# Patient Record
Sex: Female | Born: 1970 | Hispanic: No | Marital: Single | Smoking: Never smoker
Health system: Southern US, Community
[De-identification: ages and names within clinical notes are randomized; demographics above are authoritative.]

## PROBLEM LIST (undated history)

## (undated) DIAGNOSIS — Z808 Family history of malignant neoplasm of other organs or systems: Secondary | ICD-10-CM

## (undated) DIAGNOSIS — Z803 Family history of malignant neoplasm of breast: Secondary | ICD-10-CM

## (undated) DIAGNOSIS — Z8042 Family history of malignant neoplasm of prostate: Secondary | ICD-10-CM

## (undated) DIAGNOSIS — Z800 Family history of malignant neoplasm of digestive organs: Secondary | ICD-10-CM

## (undated) DIAGNOSIS — Z1501 Genetic susceptibility to malignant neoplasm of breast: Secondary | ICD-10-CM

## (undated) HISTORY — DX: Family history of malignant neoplasm of prostate: Z80.42

## (undated) HISTORY — DX: Family history of malignant neoplasm of breast: Z80.3

## (undated) HISTORY — DX: Family history of malignant neoplasm of other organs or systems: Z80.8

## (undated) HISTORY — DX: Family history of malignant neoplasm of digestive organs: Z80.0

## (undated) HISTORY — DX: Genetic susceptibility to malignant neoplasm of breast: Z15.01

---

## 2014-01-17 ENCOUNTER — Inpatient Hospital Stay (HOSPITAL_COMMUNITY): Admission: AD | Admit: 2014-01-17 | Payer: Self-pay | Source: Ambulatory Visit | Admitting: Obstetrics and Gynecology

## 2014-06-24 ENCOUNTER — Emergency Department (HOSPITAL_BASED_OUTPATIENT_CLINIC_OR_DEPARTMENT_OTHER): Payer: BC Managed Care – PPO

## 2014-06-24 ENCOUNTER — Emergency Department (HOSPITAL_BASED_OUTPATIENT_CLINIC_OR_DEPARTMENT_OTHER)
Admission: EM | Admit: 2014-06-24 | Discharge: 2014-06-24 | Disposition: A | Discharge status: Home / Self Care | Payer: BC Managed Care – PPO | Attending: Emergency Medicine | Admitting: Emergency Medicine

## 2014-06-24 ENCOUNTER — Encounter (HOSPITAL_BASED_OUTPATIENT_CLINIC_OR_DEPARTMENT_OTHER): Payer: Self-pay | Admitting: Emergency Medicine

## 2014-06-24 DIAGNOSIS — S63602A Unspecified sprain of left thumb, initial encounter: Secondary | ICD-10-CM | POA: Diagnosis not present

## 2014-06-24 DIAGNOSIS — W1830XA Fall on same level, unspecified, initial encounter: Secondary | ICD-10-CM | POA: Insufficient documentation

## 2014-06-24 DIAGNOSIS — S6991XA Unspecified injury of right wrist, hand and finger(s), initial encounter: Secondary | ICD-10-CM | POA: Diagnosis present

## 2014-06-24 DIAGNOSIS — Y9389 Activity, other specified: Secondary | ICD-10-CM | POA: Insufficient documentation

## 2014-06-24 DIAGNOSIS — S63601A Unspecified sprain of right thumb, initial encounter: Secondary | ICD-10-CM

## 2014-06-24 DIAGNOSIS — Y929 Unspecified place or not applicable: Secondary | ICD-10-CM | POA: Insufficient documentation

## 2014-06-24 MED ORDER — IBUPROFEN 800 MG PO TABS: 800.0000 mg | ORAL_TABLET | Freq: Three times a day (TID) | ORAL | Status: AC

## 2014-06-24 NOTE — ED Notes (Signed)
Pt c/o right thumb injury x 3 days

## 2014-06-24 NOTE — ED Provider Notes (Signed)
CSN: [id number]     Arrival date & time 06/24/14  1757 History   First MD Initiated Contact with Patient 06/24/14 1822     Chief Complaint  Patient presents with  . Finger Injury     (Consider location/radiation/quality/duration/timing/severity/associated sxs/prior Treatment) Patient is a 43 y.o. female presenting with hand injury. The history is provided by the patient.  Hand Injury Location:  Finger Time since incident:  2 days Injury: yes   Mechanism of injury: fall   Finger location:  R thumb Pain details:    Quality:  Aching   Radiates to:  Does not radiate   Severity:  Mild Associated symptoms: no fever     History reviewed. No pertinent past medical history. Past Surgical History  Procedure Laterality Date  . Cesarean section     History reviewed. No pertinent family history. History  Substance Use Topics  . Smoking status: Never Smoker   . Smokeless tobacco: Not on file  . Alcohol Use: No   OB History   Grav Para Term Preterm Abortions TAB SAB Ect Mult Living                 Review of Systems  Constitutional: Negative for fever and chills.  Respiratory: Negative for cough and shortness of breath.   Gastrointestinal: Negative for vomiting and abdominal pain.  All other systems reviewed and are negative.     Allergies  Review of patient's allergies indicates no known allergies.  Home Medications   Prior to Admission medications   Not on File   BP 151/91  Pulse 70  Temp(Src) 98.9 F (37.2 C)  Resp 16  Ht 5\' 4"  (1.626 m)  Wt 242 lb (109.77 kg)  BMI 41.52 kg/m2  SpO2 100%  LMP 05/29/2014 Physical Exam  Nursing note and vitals reviewed. Constitutional: She is oriented to person, place, and time. She appears well-developed and well-nourished. No distress.  HENT:  Head: Normocephalic and atraumatic.  Mouth/Throat: Oropharynx is clear and moist.  Eyes: EOM are normal. Pupils are equal, round, and reactive to light.  Neck: Normal range of  motion. Neck supple.  Cardiovascular: Normal rate and regular rhythm.  Exam reveals no friction rub.   No murmur heard. Pulmonary/Chest: Effort normal and breath sounds normal. No respiratory distress. She has no wheezes. She has no rales.  Abdominal: Soft. She exhibits no distension. There is no tenderness. There is no rebound.  Musculoskeletal: She exhibits no edema.       Right hand: She exhibits decreased range of motion (R IP joint in thumb), tenderness and swelling (mild, 1st phalanx of thumb). She exhibits no bony tenderness, normal two-point discrimination, normal capillary refill, no deformity and no laceration.  Neurological: She is alert and oriented to person, place, and time. No cranial nerve deficit. She exhibits normal muscle tone. Coordination normal.  Skin: No rash noted. She is not diaphoretic.    ED Course  Procedures (including critical care time) Labs Review Labs Reviewed - No data to display  Imaging Review Dg Finger Thumb Right  06/24/2014   CLINICAL DATA:  43 year old female with acute right thumb pain after blunt trauma 2 days ago. Redness and swelling. Initial encounter.  EXAM: RIGHT THUMB 2+V  COMPARISON:  None.  FINDINGS: Bone mineralization is within normal limits. Right thumb metacarpal and phalanges intact. Joint spaces and alignment within normal limits. No fracture identified. No radiopaque foreign body identified.  IMPRESSION: No acute osseous abnormality identified at the right thumb.  Electronically Signed   By: Lars Pinks M.D.   On: 06/24/2014 18:40     EKG Interpretation None      MDM   Final diagnoses:  Thumb sprain, right, initial encounter    60F here with R thumb injury. Was playing with son 2 days ago, thinks she jammed it. Swelling improving, most pain at IP joint of R thumb. Decreased ROM, NVI distally. Xray ok. Given thumb splint for symptomatic relief. Stable for discharge.     Evelina Bucy, MD 06/24/14 1910

## 2014-06-24 NOTE — Discharge Instructions (Signed)
Finger Sprain  A finger sprain is a tear in one of the strong, fibrous tissues that connect the bones (ligaments) in your finger. The severity of the sprain depends on how much of the ligament is torn. The tear can be either partial or complete.  CAUSES   Often, sprains are a result of a fall or accident. If you extend your hands to catch an object or to protect yourself, the force of the impact causes the fibers of your ligament to stretch too much. This excess tension causes the fibers of your ligament to tear.  SYMPTOMS   You may have some loss of motion in your finger. Other symptoms include:   Bruising.   Tenderness.   Swelling.  DIAGNOSIS   In order to diagnose finger sprain, your caregiver will physically examine your finger or thumb to determine how torn the ligament is. Your caregiver may also suggest an X-ray exam of your finger to make sure no bones are broken.  TREATMENT   If your ligament is only partially torn, treatment usually involves keeping the finger in a fixed position (immobilization) for a short period. To do this, your caregiver will apply a bandage, cast, or splint to keep your finger from moving until it heals. For a partially torn ligament, the healing process usually takes 2 to 3 weeks.  If your ligament is completely torn, you may need surgery to reconnect the ligament to the bone. After surgery a cast or splint will be applied and will need to stay on your finger or thumb for 4 to 6 weeks while your ligament heals.  HOME CARE INSTRUCTIONS   Keep your injured finger elevated, when possible, to decrease swelling.   To ease pain and swelling, apply ice to your joint twice a day, for 2 to 3 days:   Put ice in a plastic bag.   Place a towel between your skin and the bag.   Leave the ice on for 15 minutes.   Only take over-the-counter or prescription medicine for pain as directed by your caregiver.   Do not wear rings on your injured finger.   Do not leave your finger unprotected  until pain and stiffness go away (usually 3 to 4 weeks).   Do not allow your cast or splint to get wet. Cover your cast or splint with a plastic bag when you shower or bathe. Do not swim.   Your caregiver may suggest special exercises for you to do during your recovery to prevent or limit permanent stiffness.  SEEK IMMEDIATE MEDICAL CARE IF:   Your cast or splint becomes damaged.   Your pain becomes worse rather than better.  MAKE SURE YOU:   Understand these instructions.   Will watch your condition.   Will get help right away if you are not doing well or get worse.  Document Released: 09/29/2004 Document Revised: 11/14/2011 Document Reviewed: 04/25/2011  ExitCare Patient Information 2015 [employer]. This information is not intended to replace advice given to you by your health care provider. Make sure you discuss any questions you have with your health care provider.

## 2014-12-24 ENCOUNTER — Ambulatory Visit: Payer: Self-pay | Admitting: Surgery

## 2014-12-24 NOTE — H&P (Signed)
History of Present Illness Mikayla Harrington. Mikayla Hanford MD; 12/24/2014 3:57 PM) Patient words: breast mass.  The patient is a 44 year old female who presents with a breast mass. Referred by Dr. Servando Harrington for left breast mass  This is a healthy 44 year old female who presents with 3 years of a palpable mass in her left inframammary crease. This has become larger. She has had normal mammograms as well as an ultrasound of this area that showed that this likely represented just a benign sebaceous cyst. This has reached about 2.5-3 cm in size and she is concerned. She would like to have this excised. This area has never become infected or inflamed. There is been no drainage from this area. Other Problems Mikayla Harrington, CMA; 12/24/2014 3:02 PM) No pertinent past medical history  Past Surgical History Mikayla Harrington, [name]; 12/24/2014 3:02 PM) Cesarean Section - 1  Diagnostic Studies History Mikayla Harrington, CMA; 12/24/2014 3:02 PM) Colonoscopy never Mammogram within last year Pap Smear 1-5 years ago  Allergies Mikayla Harrington, CMA; 12/24/2014 3:03 PM) No Known Drug Allergies 12/24/2014  Medication History Mikayla Harrington, CMA; 12/24/2014 3:03 PM) Hydrocodone-Acetaminophen (5-325MG  Tablet, Oral) Active. Clindamycin HCl (300MG  Capsule, Oral) Active. Fluconazole (150MG  Tablet, Oral) Active. Ibuprofen (800MG  Tablet, Oral) Active. Medications Reconciled  Social History Mikayla Harrington, [name]; 12/24/2014 3:02 PM) Alcohol use Occasional alcohol use. Caffeine use Tea. No drug use Tobacco use Never smoker.  Family History Mikayla Harrington, [name]; 12/24/2014 3:02 PM) Breast Cancer Mother. Hypertension Mother, Sister. Malignant Neoplasm Of Pancreas Mother. Thyroid problems Sister.  Pregnancy / Birth History Mikayla Harrington, [name]; 12/24/2014 3:02 PM) Age at menarche 59 years. Contraceptive History Oral contraceptives. Gravida 3 Maternal age 59-20 Para 3 Regular periods     Review of  Systems Mikayla Harrington CMA; 12/24/2014 3:02 PM) General Not Present- Appetite Loss, Chills, Fatigue, Fever, Night Sweats, Weight Gain and Weight Loss. Skin Not Present- Change in Wart/Mole, Dryness, Hives, Jaundice, New Lesions, Non-Healing Wounds, Rash and Ulcer. HEENT Not Present- Earache, Hearing Loss, Hoarseness, Nose Bleed, Oral Ulcers, Ringing in the Ears, Seasonal Allergies, Sinus Pain, Sore Throat, Visual Disturbances, Wears glasses/contact lenses and Yellow Eyes. Respiratory Not Present- Bloody sputum, Chronic Cough, Difficulty Breathing, Snoring and Wheezing. Cardiovascular Present- Leg Cramps and Swelling of Extremities. Not Present- Chest Pain, Difficulty Breathing Lying Down, Palpitations, Rapid Heart Rate and Shortness of Breath. Gastrointestinal Not Present- Abdominal Pain, Bloating, Bloody Stool, Change in Bowel Habits, Chronic diarrhea, Constipation, Difficulty Swallowing, Excessive gas, Gets full quickly at meals, Hemorrhoids, Indigestion, Nausea, Rectal Pain and Vomiting. Female Genitourinary Not Present- Frequency, Nocturia, Painful Urination, Pelvic Pain and Urgency. Neurological Not Present- Decreased Memory, Fainting, Headaches, Numbness, Seizures, Tingling, Tremor, Trouble walking and Weakness. Psychiatric Not Present- Anxiety, Bipolar, Change in Sleep Pattern, Depression, Fearful and Frequent crying. Endocrine Not Present- Cold Intolerance, Excessive Hunger, Hair Changes, Heat Intolerance, Hot flashes and New Diabetes. Hematology Not Present- Easy Bruising, Excessive bleeding, Gland problems, HIV and Persistent Infections.  Vitals Mikayla Harrington CMA; 12/24/2014 3:04 PM) 12/24/2014 3:03 PM Weight: 247 lb Height: 63in Body Surface Area: 2.23 m Body Mass Index: 43.75 kg/m Temp.: 98.66F(Oral)  Pulse: 78 (Regular)  Resp.: 16 (Unlabored)  BP: 130/68 (Sitting, Left Arm, Standard)     Physical Exam Mikayla Key K. Tienna Bienkowski MD; 12/24/2014 3:53 PM)  The physical exam  findings are as follows: Note:WDWN in NAD HEENT: EOMI, sclera anicteric Neck: No masses, no thyromegaly Lungs: CTA bilaterally; normal respiratory effort Breasts - symmetric; no palpable masses in right breast; no axillary lymphadenopathy  on either side Left breast - inframammary crease - 3 cm protruding mass with central punctate opening; no drainage; no inflammation CV: Regular rate and rhythm; no murmurs Abd: +bowel sounds, soft, non-tender, no masses Ext: Well-perfused; no edema Skin: Warm, dry; no sign of jaundice    Assessment & Plan Mikayla Key K. Makenly Larabee MD; 12/24/2014 3:40 PM)  SEBACEOUS CYST OF BREAST, LEFT (610.8  N60.82)  Current Plans Schedule for Surgery - excision of sebaceous cyst - left breast - 3 cm. The surgical procedure has been discussed with the patient. Potential risks, benefits, alternative treatments, and expected outcomes have been explained. All of the patient's questions at this time have been answered. The likelihood of reaching the patient's treatment goal is good. The patient understand the proposed surgical procedure and wishes to proceed.   Mikayla Harrington. Mikayla Dover, MD, [facility]  General/ Trauma Surgery  12/24/2014 3:58 PM

## 2015-03-06 IMAGING — CR DG FINGER THUMB 2+V*R*
3 series · 3 of 3 positions shown · non-contrast
Comparison: None.

CLINICAL DATA: 42-year-old female with acute right thumb pain after
blunt trauma 2 days ago. Redness and swelling. Initial encounter.

EXAM:
RIGHT THUMB 2+V

[x finger pa right]
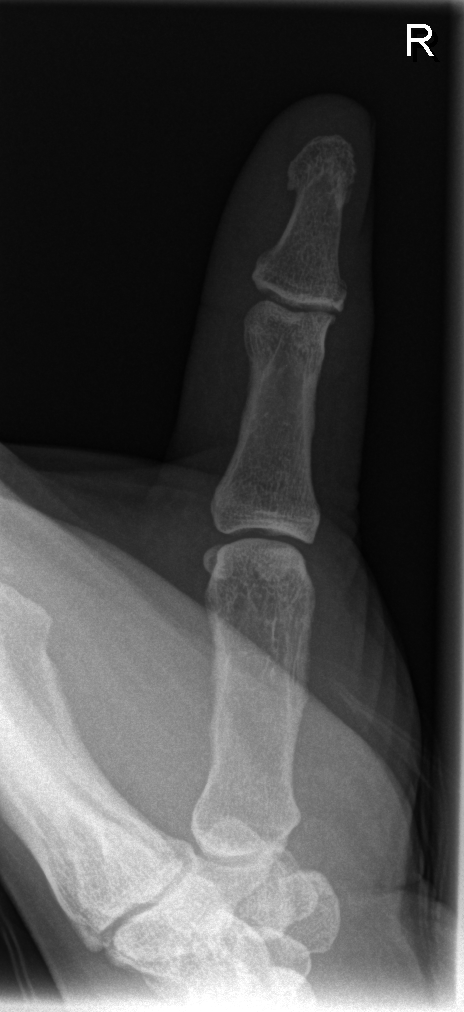

[x finger obl. right]
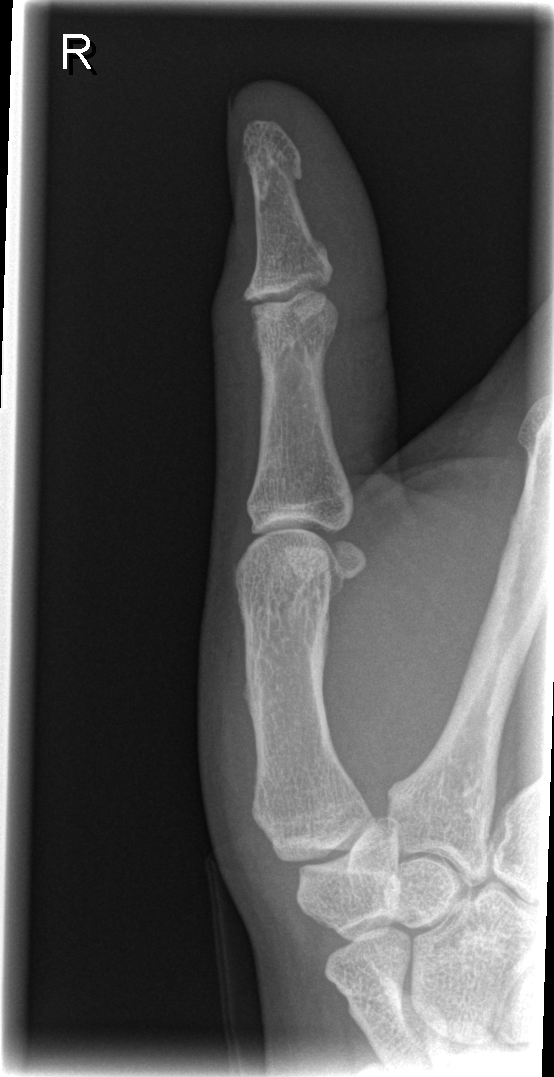

[x finger lateral right]
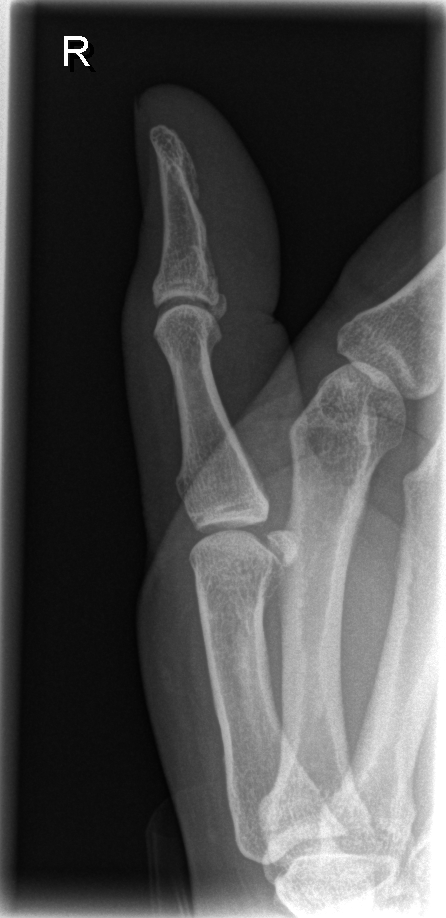

[3 of 3 positions shown; findings below may reference images not displayed]

FINDINGS: Bone mineralization is within normal limits. Right thumb metacarpal
and phalanges intact. Joint spaces and alignment within normal
limits. No fracture identified. No radiopaque foreign body
identified.
IMPRESSION: No acute osseous abnormality identified at the right thumb.

## 2015-03-16 ENCOUNTER — Encounter: Payer: BLUE CROSS/BLUE SHIELD | Attending: Obstetrics and Gynecology | Admitting: Skilled Nursing Facility1

## 2015-03-16 ENCOUNTER — Encounter: Payer: Self-pay | Admitting: Skilled Nursing Facility1

## 2015-03-16 VITALS — Ht 63.0 in | Wt 248.0 lb

## 2015-03-16 DIAGNOSIS — R7309 Other abnormal glucose: Secondary | ICD-10-CM | POA: Diagnosis not present

## 2015-03-16 DIAGNOSIS — Z713 Dietary counseling and surveillance: Secondary | ICD-10-CM | POA: Diagnosis not present

## 2015-03-16 DIAGNOSIS — E669 Obesity, unspecified: Secondary | ICD-10-CM | POA: Diagnosis not present

## 2015-03-16 DIAGNOSIS — R7303 Prediabetes: Secondary | ICD-10-CM

## 2015-03-16 DIAGNOSIS — Z6841 Body Mass Index (BMI) 40.0 and over, adult: Secondary | ICD-10-CM | POA: Diagnosis not present

## 2015-03-16 NOTE — Patient Instructions (Signed)
-  Try to only drink water -Try to avoid fried foods -Try to switch to 2% milk -Meals outside of the home once a week or less

## 2015-03-16 NOTE — Progress Notes (Signed)
  Medical Nutrition Therapy:  Appt start time: 9:15 end time:  10:00.   Assessment:  Primary concerns today: pt referred for prediabetes. Pt states her physician told her her wt is high and her A1C is high and she would like to lose wight and be healthy.Pt states she sleeps 6-7 hours a night. Pt states she eats in the kitchen table and is a slow eater. Diet hx: when living in [place] she was prescribed adipex and lost 14 pounds (2009) and kept it off until 2012-gained it back due to stress. Pts states she has not had a usual wt because she just keeps gaining over the years. Pt states she did not have gestational diabetes.  Pts A1C 6.1.  Preferred Learning Style:   No preference indicated   Learning Readiness:   Contemplating   MEDICATIONS: Multivitamin and Vitamin D   DIETARY INTAKE:  Usual eating pattern includes 3 meals and 2 snacks per day.  Everyday foods include none stated.  Avoided foods include none stated.    24-hr recall:  B ( AM): egg sandwhich Snk ( AM): popcorn L ( PM): fried chicken with beans and mac n cheese Snk ( PM): yogurt and fruit D ( PM): baked chicken, cabbage, rice  Snk ( PM): none Beverages: water, orange juice, sweet tea, whole milk  * Meals out of the house-2 times a week  Usual physical activity: ADL's  Estimated energy needs: 1800 calories 200 g carbohydrates 135 g protein 50 g fat  Progress Towards Goal(s):  In progress.   Nutritional Diagnosis:  NB-1.1 Food and nutrition-related knowledge deficit As related to no prior nutrition education from a nutrition professional.  As evidenced by BMI 43.93, A1C 6.1, pt report, and 24 hour recall.    Intervention:  Nutrition counseling for pre-diabetes. Dietitian educated the pt on pre-diabetes, a varied/balanced diet, calorically dense beverages, and the importance of physical activity.  Goals: -Try to only drink water -Try to avoid fried foods -Try to switch to 2% milk -Meals outside of  the home once a week or less  Teaching Method Utilized:  Visual Auditory  Handouts given during visit include: -MyPlate  Snack sheet  Demonstrated degree of understanding via:  Teach Back   Monitoring/Evaluation:  Dietary intake, exercise, A1C, and body weight prn.

## 2015-05-18 ENCOUNTER — Encounter: Payer: Self-pay | Admitting: Skilled Nursing Facility1

## 2015-05-18 ENCOUNTER — Encounter: Payer: BLUE CROSS/BLUE SHIELD | Attending: Obstetrics and Gynecology | Admitting: Skilled Nursing Facility1

## 2015-05-18 VITALS — Ht 63.0 in | Wt 248.0 lb

## 2015-05-18 DIAGNOSIS — R739 Hyperglycemia, unspecified: Secondary | ICD-10-CM

## 2015-05-18 DIAGNOSIS — R7309 Other abnormal glucose: Secondary | ICD-10-CM | POA: Diagnosis not present

## 2015-05-18 DIAGNOSIS — E669 Obesity, unspecified: Secondary | ICD-10-CM | POA: Insufficient documentation

## 2015-05-18 DIAGNOSIS — Z6841 Body Mass Index (BMI) 40.0 and over, adult: Secondary | ICD-10-CM | POA: Insufficient documentation

## 2015-05-18 DIAGNOSIS — Z713 Dietary counseling and surveillance: Secondary | ICD-10-CM | POA: Insufficient documentation

## 2015-05-18 NOTE — Progress Notes (Signed)
  Medical Nutrition Therapy:  Appt start time: 1130 end time:  1200.   Assessment:  Primary concerns today: follow up from July for prediabetes. Successes: more vegetables and fruit love spinach, no more fried foods, loves carrots, smaller pants and shirt size (16-14), feels better: feels better about herself and sleeps better. Down to 1% milk. Pt is doing fabulously with a lot of great changes made. No new labs to indicate A1C.  Preferred Learning Style:   No preference indicated   Learning Readiness:   Change in progress   MEDICATIONS: See List   DIETARY INTAKE:  Usual eating pattern includes 3 meals and 2-3 snacks per day.  Everyday foods include none stated.  Avoided foods include none stated.    24-hr recall:  B ( AM): egg and toast  Snk ( AM): grapes or apple L ( PM): chicken and carrots-----baked fish, potato, and carrots or broccoli Snk ( PM): none D ( PM): grilled chicken and spinach and lettuce Snk ( PM): sugar free jello Beverages: water, decaf tea or green tea, diet mountain dew  Usual physical activity: walk 20 minutes, sit ups/squats every night, oliptical machine for 30 minutes 2 days a week  Estimated energy needs: 1600 calories 180 g carbohydrates 120 g protein 44 g fat  Progress Towards Goal(s):  In progress.   Nutritional Diagnosis:  No nutritional diagnosis at this time as the pt is working towards her goals efficiently and steadily.     Intervention:  Increase your physical activity days by one and incorporate some weight bearing exercises. If you want to have a smoothie for breakfast sometimes add protein like milk or peanut butter. Try Kefir to get your gut microbiota back in balance.   Teaching Method Utilized:  Visual Auditory  Barriers to learning/adherence to lifestyle change: none identified  Demonstrated degree of understanding via:  Teach Back   Monitoring/Evaluation:  Dietary intake, exercise, and body weight prn.

## 2016-08-15 ENCOUNTER — Other Ambulatory Visit: Payer: Self-pay | Admitting: Hematology and Oncology

## 2020-07-17 ENCOUNTER — Telehealth: Payer: Self-pay | Admitting: Genetic Counselor

## 2020-07-17 NOTE — Telephone Encounter (Signed)
Received a genetic counseling referral from Dr. Garwin Brothers for BRCA2 gene mutation positive in female. Mikayla Harrington returned my call and has been scheduled to see emily on 12/2 at 1pm. Pt will bring her genetic test results along to her appointment.

## 2020-08-06 ENCOUNTER — Inpatient Hospital Stay: Payer: 59 | Attending: Genetic Counselor | Admitting: Genetic Counselor

## 2020-08-06 ENCOUNTER — Other Ambulatory Visit: Payer: Self-pay

## 2020-08-06 DIAGNOSIS — Z8042 Family history of malignant neoplasm of prostate: Secondary | ICD-10-CM

## 2020-08-06 DIAGNOSIS — Z808 Family history of malignant neoplasm of other organs or systems: Secondary | ICD-10-CM

## 2020-08-06 DIAGNOSIS — Z1379 Encounter for other screening for genetic and chromosomal anomalies: Secondary | ICD-10-CM

## 2020-08-06 DIAGNOSIS — Z803 Family history of malignant neoplasm of breast: Secondary | ICD-10-CM

## 2020-08-06 DIAGNOSIS — Z1509 Genetic susceptibility to other malignant neoplasm: Secondary | ICD-10-CM

## 2020-08-06 DIAGNOSIS — Z800 Family history of malignant neoplasm of digestive organs: Secondary | ICD-10-CM

## 2020-08-06 DIAGNOSIS — Z1501 Genetic susceptibility to malignant neoplasm of breast: Secondary | ICD-10-CM

## 2020-08-07 ENCOUNTER — Encounter: Payer: Self-pay | Admitting: Genetic Counselor

## 2020-08-07 DIAGNOSIS — Z8042 Family history of malignant neoplasm of prostate: Secondary | ICD-10-CM | POA: Insufficient documentation

## 2020-08-07 DIAGNOSIS — Z1501 Genetic susceptibility to malignant neoplasm of breast: Secondary | ICD-10-CM | POA: Insufficient documentation

## 2020-08-07 DIAGNOSIS — Z1509 Genetic susceptibility to other malignant neoplasm: Secondary | ICD-10-CM | POA: Insufficient documentation

## 2020-08-07 DIAGNOSIS — Z803 Family history of malignant neoplasm of breast: Secondary | ICD-10-CM | POA: Insufficient documentation

## 2020-08-07 DIAGNOSIS — Z808 Family history of malignant neoplasm of other organs or systems: Secondary | ICD-10-CM | POA: Insufficient documentation

## 2020-08-07 DIAGNOSIS — Z800 Family history of malignant neoplasm of digestive organs: Secondary | ICD-10-CM | POA: Insufficient documentation

## 2020-08-07 NOTE — Progress Notes (Signed)
REFERRING PROVIDER: Maxie Better, MD [address] [place],  [place] [address]  PRIMARY PROVIDER:  Patient, No Pcp Per  PRIMARY REASON FOR VISIT:  1. BRCA2 positive   2. Family history of breast cancer   3. Family history of pancreatic cancer   4. Family history of prostate cancer   5. Family history of bone cancer      HISTORY OF PRESENT ILLNESS:   Mikayla Harrington, a 49 y.o. female, was seen for a [facility] cancer genetics consultation at the request of Dr. Cherly Hensen due to a personal history of a BRCA2 mutation. We reviewed Mikayla Harrington recent genetic test results, including known cancer risks, screening recommendations, and risk-reduction recommendations. These results and recommendations are discussed in more detail below.   Mikayla Harrington does not have a personal history of cancer.   FAMILY HISTORY:  We obtained a detailed, 4-generation family history.  Significant diagnoses are listed below: Family History  Problem Relation Age of Onset  . Cancer Other   . Hypertension Other   . Breast cancer Mother 61       diagnosed second time age 27, third time age 23  . Pancreatic cancer Mother 101  . Prostate cancer Father 18  . Other Sister        BRCA negative  . Breast cancer Paternal Aunt        dx 26s  . Prostate cancer Paternal Uncle        dx 74s, metastatic  . Cancer Maternal Grandmother 20       unknown type, dx 28s  . BRCA 1/2 Maternal Aunt        BRCA positive  . Other Maternal Aunt        BRCA negative  . Breast cancer Cousin 42       maternal first cousin (Joan's daughter)  . BRCA 1/2 Cousin        maternal first cousin (Joan's daughter)  . Breast cancer Paternal Aunt        dx 28s  . Bone cancer Cousin 40       paternal first cousin (Gladys's daughter)   Mikayla Harrington has one son and two daughters (ages 20-27). She has one sister (age 38) who previously had genetic testing of the BRCA genes that was negative (report not available for review). None of these  family members have had cancer.  Mikayla Harrington mother died at the age of 51 and had breast cancer three times (diagnosed ages 12, 39, and 57), as well as pancreatic cancer (diagnosed age 68). She did not have genetic testing. Mikayla Harrington has seven maternal aunts and two maternal uncles (two aunts and one uncle are half-siblings to her mother). None of the aunts or uncles have had cancer, but one aunt tested positive for a BRCA mutation. This aunt's daughter was diagnosed with breast cancer at age 41 and has a BRCA mutation. [name] maternal grandmother died in her 49s from cancer (unknown type). Her maternal grandfather died at age 53 without cancer. There are no other known diagnoses of cancer on the maternal side of the family.  Mikayla Harrington father is 42 and was diagnosed with prostate cancer at age 56. She had four paternal aunts and four paternal uncles. Two aunts had breast cancer (one diagnosed in her 66s, the other diagnosed in her 70s). One uncle died from metastatic prostate cancer in his 10s. One maternal cousin was diagnosed with bone cancer at age 53. Her  paternal grandmother died at age 18 and her paternal grandfather died around age 25. There are no other known cancer diagnoses on the paternal side of the family.  Mikayla Harrington is aware of hereditary cancer genetic testing in multiple maternal relatives (results not available for review at today's appointment). Mikayla Harrington's maternal ancestors are of [place] American and Native American descent, and paternal ancestors are of African American descent. There is no reported Ashkenazi Jewish ancestry. There is no known consanguinity.  GENETIC TESTING: Genetic testing reported on 06/01/2020 through the [facility] Multi-Cancer Panel offered by [employer]. A single, heterozygous pathogenic variant was detected in the BRCA2 gene called c.[id number] (p.Ser2201*). 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  The Multi-Cancer Panel offered by [facility] includes sequencing and/or deletion duplication testing of the following 85 genes: AIP, ALK, APC, ATM, AXIN2,BAP1,  BARD1, BLM, BMPR1A, BRCA1, BRCA2, BRIP1, CASR, CDC73, CDH1, CDK4, CDKN1B, CDKN1C, CDKN2A (p14ARF), CDKN2A (p16INK4a), CEBPA, CHEK2, CTNNA1, DICER1, DIS3L2, EGFR (c.2369C>T, p.Thr790Met variant only), EPCAM (Deletion/duplication testing only), FH, FLCN, GATA2, GPC3, GREM1 (Promoter region deletion/duplication testing only), HOXB13 (c.251G>A, p.Gly84Glu), HRAS, KIT, MAX, MEN1, MET, MITF (c.952G>A, p.Glu318Lys variant only), MLH1, MSH2, MSH3, MSH6, MUTYH, NBN, NF1, NF2, NTHL1, PALB2, PDGFRA, PHOX2B, PMS2, POLD1, POLE, POT1, PRKAR1A, PTCH1, PTEN, RAD50, RAD51C, RAD51D, RB1, RECQL4, RET, RNF43, RUNX1, SDHAF2, SDHA (sequence changes only), SDHB, SDHC, SDHD, SMAD4, SMARCA4, SMARCB1, SMARCE1, STK11, SUFU, TERC, TERT, TMEM127, TP53, TSC1, TSC2, VHL, WRN and WT1.  . The test report will be scanned into EPIC and located under the Molecular Pathology section of the Results Review tab.  A portion of the result report is included below for reference.     Genetic testing also identified a variant of uncertain significance (VUS) in the VHL gene called c.634G>T (p.Gly212*).  At this time, it is unknown if this variant is associated with increased cancer risk or if this is a normal finding, but most variants such as this get reclassified to being inconsequential. It should not be used to make medical management decisions. With time, we suspect the lab will determine the significance of this variant, if any. If we do learn more about it, we will try to contact Ms. Lieser to discuss it further. However, it is important to stay in touch with [place] periodically and keep the address and phone number up to date.  CANCER RISKS: Both women and men with a BRCA2 mutation are at an increased risk for cancer. Studies show that women with a BRCA2 mutation have a  61-77% lifetime risk to develop breast cancer, up to a 26% risk to develop a second breast cancer within 20 years, and up to an 11-25% risk to develop ovarian cancer. Men have a 7-8% lifetime risk to develop female breast cancer and a 20-30% risk for prostate cancer. Both men and women also have a 3-5% increased risk for pancreatic cancer and a 3-5% increased risk for melanoma.  CANCER RISK REDUCTION & SCREENING RECOMMENDATIONS: The [place] (NCCN) recommends the following for women who carry BRCA mutations: . Breast awareness starting at the age of 52 years; women should report any changes to their breasts to their health care provider. Periodic breast self-exams may facilitate breast self-awareness. . Clinical breast exams every 6-12 months, starting at age 64 years . Annual breast MRI and annual mammograms starting at age 76 years and 30 years, respectively, or individualized based on age of earliest onset of breast cancer in  the family. . Consideration of risk reducing mastectomy, which reduces the risk of breast cancer by greater than 90%. . Consideration of risk reducing salpingo-oophorectomy (RRSO), ideally between the ages of 63-40, or individualized based on completion of child-bearing years or age of earliest onset of ovarian cancer in the family. RRSO reduces the risk of ovarian cancer by greater than 90% and can reduce the risk of breast cancer by 50% if performed prior to menopause. Women who have undergone risk reducing RRSO have a small risk of peritoneal carcinoma and can consider annual CA-125 surveillance. . For women who still have their ovaries, transvaginal ultrasound and CA-125 testing every 6 months starting at age 46 years, or 5-10 years prior to the earliest age of onset of ovarian cancer in the family can be considered. Studies have not demonstrated that ovarian cancer screening is effective in detecting early ovarian cancer. . Consideration of  chemoprevention options such as oral contraceptives, Tamoxifen and Raloxifene, if appropriate for an individual.  As discussed with Ms. Faidley, to reduce the risk for breast cancer, prophylactic bilateral mastectomy is the most effective option for risk reduction. However, for women who choose to keep their breasts intensified screening is equally safe. We recommend yearly mammograms, yearly breast MRI, twice-yearly clinical breast exams, and monthly self-breast exams. Ms. Nienaber will follow up with her OBGYN and/or primary care provider in regards to her risk for breast cancer.    To reduce the risk for ovarian cancer, we recommend Ms. Asche have a prophylactic bilateral salpingo-oophorectomy when childbearing is completed, if planned. We discussed that screening with CA-125 blood tests and transvaginal ultrasounds can be done twice per year. However, these tests have not been shown to detect ovarian cancer at an early stage.  Ms. Soh will follow up with her OBGYN, Dr. Garwin Brothers, in regards to her ovarian cancer risk.    The [employer]  (NCCN) recommends the following for men who carry a BRCA2 mutation: . Breast self-exam training and education starting at age 40 years . Clinical breast exam, every 12 months, starting at age 36 years . Consider annual mammogram screening in men with gynecomastia starting at age 27, or 52 years before the earliest known female breast cancer in the family (whichever comes first) . Prostate cancer screening starting at age 76 years (PSA and digital rectal exam)  The following is recommended for both men and women who carry a BRCA2 mutation: . Consideration of pancreatic cancer screening if there is a family history of pancreatic cancer . General melanoma risk management, such as annual full-body skin examination and minimizing UV exposure . Consider investigational imaging and screening studies, when available (e.g. novel imaging technologies, more  frequent screening intervals) in the context of a clinical trial  Because Ms. Whiters reports a family history of pancreatic cancer in her mother, we recommend that she follow-up with a GI provider to discuss and coordinate pancreatic cancer screening. Ms. Wawrzyniak does not have a GI specialist with whom she is currently established. We offered a referral to see Dr. Bryan Lemma at [place]; however, Ms. Sabater declined as she would prefer to find a GI provider closer to where she lives. We encouraged her to let us know if she needs assistance in finding a GI provider.   RISK REDUCTION: There are several things that can be offered to individuals who are carriers for BRCA mutations that will reduce the risk for getting cancer.    The use of oral contraceptives can lower  the risk for ovarian cancer, and, per case control studies, does not significantly increase the risk for breast cancer in BRCA patients.  Case control studies have shown that oral contraceptives can lower the risk for ovarian cancer in women with BRCA mutations. Additionally, a more recent meta-analysis, including one corhort (n=3,181) and one case control study (1,096 cases and 2,878 controls) also showed an inverse correlation between ovarian cancer and ever having used oral contraceptives (OR, 0.58; 95% CI = 0.46-0.73).  Studies on oral contraceptives and breast cancer have been conflicting, with some studies suggesting that there is not an increased risk for breast cancer in BRCA mutation carriers, while others suggest that there could be a risk.  That said, two meta-analysis studies have shown that there is not an increased risk for breast cancer with oral contraceptive use in BRCA1 and BRCA2 carriers.    In individuals who have a prophylactic bilateral salpino-oophorectomy (BSO), the risk for breast cancer is reduced by up to 50%.  It has been reported that short term hormone replacement therapy in women undergoing prophylactic BSO does not  negate the reduction of breast cancer risk associated with surgery (3.6629 NCCN guidelines).   FAMILY MEMBERS: It is important that all of Ms. Robley relatives (both men and women) know of the presence of this gene mutation. Site-specific genetic testing can sort out who in the family is at risk and who is not.   Ms. Bollier children each have a 50% chance to have inherited this mutation. We recommend they have genetic testing for this same mutation, as identifying the presence of this mutation would allow them to also take advantage of risk-reducing measures.  Additionally, individuals with a pathogenic variant in the BRCA2 gene are carriers of a genetic condition called Fanconi anemia. Fanconi anemia is an autosomal recessive disorder that is characterized by bone marrow failure and variable presentation of anomalies, including short stature, abnormal skin pigmentation, abnormal thumbs, malformations of the skeletal and central nervous systems, and developmental delay. Risks for leukemia and early onset solid tumors are significantly elevated. For there to be a risk of Fanconi anemia in offspring, both parents would each have to have a single pathogenic variant in the BRCA2 gene; in such a case, the risk of having an affected child is 25%.  SUPPORT AND RESOURCES: If Ms. Huizar is interested in BRCA-specific information and support, there are two groups, Facing Our Risk (www.facingourrisk.com) and Bright Pink (www.brightpink.org) which some people have found useful. They provide opportunities to speak with other individuals from high-risk families. To locate genetic counselors in other cities, visit the website of the [employer] (ArtistMovie.se) and Secretary/administrator for a Social worker by zip code.  We encouraged Ms. Massaro to remain in contact with [place] on an annual basis so we can update her personal and family histories, and let her know of advances in cancer genetics that may benefit the  family. Our contact number was provided. Ms. Heatwole questions were answered to her satisfaction today, and she knows she is welcome to call anytime with additional questions.   Clint Guy, [place], [facility] Licensed, Certified Dispensing [email or web address] phone: [phone]  The Mikayla Harrington was seen for a total of 40 minutes in face-to-face genetic counseling.

## 2020-08-10 ENCOUNTER — Encounter: Payer: Self-pay | Admitting: Genetic Counselor

## 2020-09-16 ENCOUNTER — Other Ambulatory Visit: Payer: Self-pay | Admitting: Obstetrics and Gynecology

## 2020-09-16 DIAGNOSIS — C50919 Malignant neoplasm of unspecified site of unspecified female breast: Secondary | ICD-10-CM

## 2020-10-09 ENCOUNTER — Telehealth: Payer: Self-pay | Admitting: Genetic Counselor

## 2020-10-09 NOTE — Telephone Encounter (Signed)
Office notes have been faxed to Dr. Garwin Brothers office at [phone]. Received a confirmation the records went through.

## 2020-10-23 ENCOUNTER — Encounter (HOSPITAL_BASED_OUTPATIENT_CLINIC_OR_DEPARTMENT_OTHER): Admission: RE | Payer: Self-pay | Source: Home / Self Care

## 2020-10-23 ENCOUNTER — Ambulatory Visit (HOSPITAL_BASED_OUTPATIENT_CLINIC_OR_DEPARTMENT_OTHER): Admission: RE | Admit: 2020-10-23 | Payer: 59 | Source: Home / Self Care | Admitting: Obstetrics and Gynecology

## 2020-10-23 SURGERY — HYSTERECTOMY, TOTAL, ROBOT-ASSISTED, LAPAROSCOPIC, WITH BILATERAL SALPINGO-OOPHORECTOMY
Anesthesia: General
